# Patient Record
Sex: Female | Born: 2005
Health system: Southern US, Community
[De-identification: ages and names within clinical notes are randomized; demographics above are authoritative.]

---
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
## 2024-01-17 ENCOUNTER — Telehealth: Payer: Self-pay

## 2024-01-17 NOTE — Telephone Encounter (Unsigned)
 Copied from CRM [id number]. Topic: Clinical - Lab/Test Results >> Jan 17, 2024  9:14 AM Alfonso ORN wrote: Reason for CRM: patient returning call received on  12/27/23 regarding her lab results ,Agent provided patient information regarding  test results verbatim from [facility] , patient would like a callback to further go over the test results   Please call penny (mom) [phone] - daughter is in class and ask if can call her mom and related the message regarding the test results

## 2024-01-17 NOTE — Telephone Encounter (Signed)
 Discussed with mother. LS

## 2024-03-01 ENCOUNTER — Other Ambulatory Visit: Payer: Self-pay | Admitting: Family Medicine

## 2024-12-26 ENCOUNTER — Encounter: Payer: Self-pay | Admitting: Family Medicine
